# Patient Record
Sex: Female | Born: 1995 | Race: Black or African American | Hispanic: No | Marital: Married | Smoking: Never smoker
Health system: Southern US, Community
[De-identification: ages and names within clinical notes are randomized; demographics above are authoritative.]

## PROBLEM LIST (undated history)

## (undated) HISTORY — PX: BREAST REDUCTION SURGERY: SHX8

---

## 2019-06-05 ENCOUNTER — Emergency Department
Admission: EM | Admit: 2019-06-05 | Discharge: 2019-06-05 | Disposition: A | Discharge status: Home / Self Care | Payer: Self-pay | Attending: Emergency Medicine | Admitting: Emergency Medicine

## 2019-06-05 ENCOUNTER — Emergency Department: Payer: Self-pay

## 2019-06-05 ENCOUNTER — Other Ambulatory Visit: Payer: Self-pay

## 2019-06-05 DIAGNOSIS — O468X1 Other antepartum hemorrhage, first trimester: Secondary | ICD-10-CM | POA: Insufficient documentation

## 2019-06-05 DIAGNOSIS — O4690 Antepartum hemorrhage, unspecified, unspecified trimester: Secondary | ICD-10-CM

## 2019-06-05 DIAGNOSIS — O418X11 Other specified disorders of amniotic fluid and membranes, first trimester, fetus 1: Secondary | ICD-10-CM | POA: Insufficient documentation

## 2019-06-05 DIAGNOSIS — O418X10 Other specified disorders of amniotic fluid and membranes, first trimester, not applicable or unspecified: Secondary | ICD-10-CM

## 2019-06-05 DIAGNOSIS — Z3A10 10 weeks gestation of pregnancy: Secondary | ICD-10-CM | POA: Insufficient documentation

## 2019-06-05 LAB — BASIC METABOLIC PANEL
Anion gap: 8 (ref 5–15)
BUN: 9 mg/dL (ref 6–20)
CO2: 22 mmol/L (ref 22–32)
Calcium: 9.2 mg/dL (ref 8.9–10.3)
Chloride: 105 mmol/L (ref 98–111)
Creatinine, Ser: 0.58 mg/dL (ref 0.44–1.00)
GFR calc Af Amer: >60 mL/min (ref >60)
GFR calc non Af Amer: >60 mL/min (ref >60)
Glucose, Bld: 88 mg/dL (ref 70–99)
Potassium: 3.7 mmol/L (ref 3.5–5.1)
Sodium: 135 mmol/L (ref 135–145)

## 2019-06-05 LAB — CBC
HCT: 37 % (ref 36.0–46.0)
Hemoglobin: 12.3 g/dL (ref 12.0–15.0)
MCH: 30.1 pg (ref 26.0–34.0)
MCHC: 33.2 g/dL (ref 30.0–36.0)
MCV: 90.7 fL (ref 80.0–100.0)
Platelets: 258 10*3/uL (ref 150–400)
RBC: 4.08 MIL/uL (ref 3.87–5.11)
RDW: 11.9 % (ref 11.5–15.5)
WBC: 8 10*3/uL (ref 4.0–10.5)
nRBC: 0 % (ref 0.0–0.2)

## 2019-06-05 LAB — HCG, QUANTITATIVE, PREGNANCY: hCG, Beta Chain, Quant, S: 127137 m[IU]/mL — ABNORMAL HIGH (ref <5)

## 2019-06-05 LAB — ABO/RH: ABO/RH(D): O POS

## 2019-06-05 LAB — POCT PREGNANCY, URINE: Preg Test, Ur: POSITIVE — AB

## 2019-06-05 NOTE — ED Triage Notes (Signed)
Pt has been having light bleeding and pelvic pain that started 3 weeks ago.

## 2019-06-05 NOTE — ED Provider Notes (Signed)
[facility] Emergency Department Provider Note   ____________________________________________    I have reviewed the triage vital signs and the nursing notes.   HISTORY  Chief Complaint Vaginal Bleeding     HPI Caroline Ramirez is a 24 y.o. female reports that she is approximately [redacted] weeks pregnant who has not had any prenatal care to this point.  Recently moved to the area.  She reports vaginal spotting for approximately 3 weeks.  Occasional lower abdominal cramping.  Denies fevers or chills.  No dysuria.  No nausea or vomiting.  This is her first pregnancy.  No sick contacts.  Has not take anything for this  History reviewed. No pertinent past medical history.  There are no problems to display for this patient.   History reviewed. No pertinent surgical history.  Prior to Admission medications   Not on File     Allergies Patient has no allergy information on record.  History reviewed. No pertinent family history.  Social History Social History   Tobacco Use  . Smoking status: Never Smoker  . Smokeless tobacco: Never Used  Substance Use Topics  . Alcohol use: Not on file  . Drug use: Not on file    Review of Systems  Constitutional: No fever/chills Eyes: No visual changes.  ENT: No sore throat. Cardiovascular: No chest pain. Respiratory: Denies shortness of breath. Gastrointestinal: As above Genitourinary: As above Musculoskeletal: Negative for back pain. Skin: Negative for rash. Neurological: Negative for headaches or weakness   ____________________________________________   PHYSICAL EXAM:  VITAL SIGNS: ED Triage Vitals [06/05/19 0725]  Enc Vitals Group     BP (!) 132/94     Pulse Rate 82     Resp 16     Temp 98.2 F (36.8 C)     Temp Source Oral     SpO2 100 %     Weight 96.6 kg (213 lb)     Height 1.651 m (5\' 5" )     Head Circumference      Peak Flow      Pain Score 5     Pain Loc      Pain Edu?    Excl. in GC?     Constitutional: Alert and oriented.   Nose: No congestion/rhinnorhea. Mouth/Throat: Mucous membranes are moist.   Neck:  Painless ROM Cardiovascular: Normal rate, regular rhythm.  Good peripheral circulation. Respiratory: Normal respiratory effort.  No retractions. Gastrointestinal: Soft and nontender. No distention.  No CVA tenderness.  Musculoskeletal:  Warm and well perfused Neurologic:  Normal speech and language. No gross focal neurologic deficits are appreciated.  Skin:  Skin is warm, dry and intact. No rash noted. Psychiatric: Mood and affect are normal. Speech and behavior are normal.  ____________________________________________   LABS (all labs ordered are listed, but only abnormal results are displayed)  Labs Reviewed  HCG, QUANTITATIVE, PREGNANCY - Abnormal; Notable for the following components:      Result Value   hCG, Beta Chain, Quant, S 127,137 (*)    All other components within normal limits  POCT PREGNANCY, URINE - Abnormal; Notable for the following components:   Preg Test, Ur POSITIVE (*)    All other components within normal limits  CBC  BASIC METABOLIC PANEL  POC URINE PREG, ED  ABO/RH   ____________________________________________  EKG  None ____________________________________________  RADIOLOGY  Ultrasound demonstrates small subchorionic hemorrhage ____________________________________________   PROCEDURES  Procedure(s) performed: No  Procedures   Critical Care performed: No ____________________________________________  INITIAL IMPRESSION / ASSESSMENT AND PLAN / ED COURSE  Pertinent labs & imaging results that were available during my care of the patient were reviewed by me and considered in my medical decision making (see chart for details).  Patient presents approximately [redacted] weeks pregnant with vaginal spotting x3 weeks.  Differential includes subchorionic hemorrhage, miscarriage, less likely ectopic  pregnancy, possible cyst  Will obtain hCG, ABO Rh and obtain ultrasound.  Patient is Rh+, hCG 127,000.  Ultrasound performed IUP, live, 10 weeks 2 days, small subchorionic hemorrhage.  Discussed with patient, referred to health department for arrangement for OB follow-up.    ____________________________________________   FINAL CLINICAL IMPRESSION(S) / ED DIAGNOSES  Final diagnoses:  Vaginal bleeding in pregnancy  Subchorionic hematoma in first trimester, single or unspecified fetus        Note:  This document was prepared using Dragon voice recognition software and may include unintentional dictation errors.   Lavonia Drafts, MD 06/05/19 1255

## 2019-06-11 NOTE — Progress Notes (Signed)
Record abstracted per 06/11/19 phone interview Hal Hope, RN; Sharlette Dense, RN

## 2019-06-12 ENCOUNTER — Other Ambulatory Visit: Payer: Self-pay | Admitting: Physician Assistant

## 2019-06-12 ENCOUNTER — Encounter: Payer: Self-pay | Admitting: Physician Assistant

## 2019-06-12 ENCOUNTER — Ambulatory Visit: Payer: Medicaid Other | Admitting: Physician Assistant

## 2019-06-12 ENCOUNTER — Other Ambulatory Visit: Payer: Self-pay

## 2019-06-12 VITALS — BP 129/80 | HR 102 | Temp 98.4°F | Wt 217.0 lb

## 2019-06-12 DIAGNOSIS — O209 Hemorrhage in early pregnancy, unspecified: Secondary | ICD-10-CM

## 2019-06-12 DIAGNOSIS — Z3400 Encounter for supervision of normal first pregnancy, unspecified trimester: Secondary | ICD-10-CM

## 2019-06-12 DIAGNOSIS — O99210 Obesity complicating pregnancy, unspecified trimester: Secondary | ICD-10-CM

## 2019-06-12 DIAGNOSIS — E669 Obesity, unspecified: Secondary | ICD-10-CM | POA: Insufficient documentation

## 2019-06-12 DIAGNOSIS — Z23 Encounter for immunization: Secondary | ICD-10-CM

## 2019-06-12 DIAGNOSIS — Z1331 Encounter for screening for depression: Secondary | ICD-10-CM | POA: Insufficient documentation

## 2019-06-12 DIAGNOSIS — Z369 Encounter for antenatal screening, unspecified: Secondary | ICD-10-CM

## 2019-06-12 DIAGNOSIS — N926 Irregular menstruation, unspecified: Secondary | ICD-10-CM | POA: Insufficient documentation

## 2019-06-12 LAB — URINALYSIS
Bilirubin, UA: NEGATIVE
Ketones, UA: NEGATIVE
Leukocytes,UA: NEGATIVE
Nitrite, UA: NEGATIVE
Protein,UA: NEGATIVE
RBC, UA: NEGATIVE
Specific Gravity, UA: 1.03 (ref 1.005–1.030)
Urobilinogen, Ur: 0.2 mg/dL (ref 0.2–1.0)
pH, UA: 6 (ref 5.0–7.5)

## 2019-06-12 LAB — OB RESULTS CONSOLE HIV ANTIBODY (ROUTINE TESTING): HIV: NONREACTIVE

## 2019-06-12 MED ORDER — ASPIRIN EC 81 MG PO TBEC: 81.0000 mg | DELAYED_RELEASE_TABLET | Freq: Every day | ORAL | Status: AC

## 2019-06-12 NOTE — Progress Notes (Signed)
[facility] HEALTH DEPT [facility] [address] [place] [address] [phone]  INITIAL PRENATAL VISIT NOTE  Subjective:  Caroline Ramirez is a 24 y.o. G1P0 at [redacted]w[redacted]d being seen today to start prenatal care at the [facility] Department.  She is currently monitored for the following issues for this high-risk pregnancy and has Supervision of normal first pregnancy, antepartum; Obesity affecting pregnancy, antepartum; Vaginal bleeding in pregnancy, first trimester; Class 2 obesity without serious comorbidity with body mass index (BMI) of 36.0 to 36.9 in adult; Positive depression screening; and Irregular menses on their problem list.  Patient reports bilat breast tenderness.  Contractions: Not present. Vag. Bleeding: None.  Movement: Absent. Denies leaking of fluid. Very happy about pregnancy, desired for 1 year with partner, h/o irreg menses. Moved from [place] 1 wk ago with partner, looking for work. Mom lives in [place] and may move to [facility] [facility] for local support.  Indications for ASA therapy (per uptodate) One of the following: Previous pregnancy with preeclampsia, especially early onset and with an adverse outcome No Multifetal gestation No Chronic hypertension No Type 1 or 2 diabetes mellitus No Chronic kidney disease No Autoimmune disease (antiphospholipid syndrome, systemic lupus erythematosus) No  Two or more of the following: Nulliparity Yes Obesity (body mass index >30 kg/m2) Yes Family history of preeclampsia in mother or sister No Age ?35 years No Sociodemographic characteristics (African American race, low socioeconomic level) Yes Personal risk factors (eg, previous pregnancy with low birth weight or small for gestational age infant, previous adverse pregnancy outcome [eg, stillbirth], interval >10 years between pregnancies) No   The following portions of the patient's history were reviewed and updated as appropriate: allergies,  current medications, past family history, past medical history, past social history, past surgical history and problem list. Problem list updated.  Objective:   Vitals:   06/12/19 0859  BP: 129/80  Pulse: (!) 102  Temp: 98.4 F (36.9 C)  Weight: 217 lb (98.4 kg)    Fetal Status: Fetal Heart Rate (bpm): not heard Fundal Height: 12 cm Movement: Absent  Presentation: Undeterminable   Physical Exam Constitutional:      General: She is not in acute distress.    Appearance: She is obese. She is not toxic-appearing.  HENT:     Head: Normocephalic and atraumatic.     Right Ear: External ear normal.     Left Ear: External ear normal.     Nose: No congestion or rhinorrhea.     Mouth/Throat:     Mouth: Mucous membranes are moist.     Pharynx: Oropharynx is clear. No oropharyngeal exudate or posterior oropharyngeal erythema.  Eyes:     General: No scleral icterus.    Extraocular Movements: Extraocular movements intact.     Conjunctiva/sclera: Conjunctivae normal.  Cardiovascular:     Rate and Rhythm: Normal rate and regular rhythm.     Heart sounds: Normal heart sounds. No murmur.  Pulmonary:     Effort: Pulmonary effort is normal. No respiratory distress.     Breath sounds: Normal breath sounds. No wheezing.  Chest:     Breasts:        Right: Skin change and tenderness present. No inverted nipple or nipple discharge.        Left: Inverted nipple and tenderness present. No nipple discharge.     Comments: Surgical scars under breasts. Hypopigmentation R nipple - pt states present since breast reduction surgery 1 year ago Abdominal:  General: Bowel sounds are normal.     Palpations: Abdomen is soft.     Tenderness: There is no abdominal tenderness. There is no guarding.  Genitourinary:    General: Normal vulva.     Exam position: Lithotomy position.     Pubic Area: No rash.      Labia:        Right: No rash, tenderness or lesion.        Left: No rash, tenderness or lesion.       Urethra: No urethral lesion.     Vagina: No vaginal discharge, erythema, tenderness, bleeding or lesions.     Cervix: No discharge, friability, lesion or cervical bleeding.     Uterus: Enlarged. Not tender.      Adnexa:        Right: No tenderness.         Left: No tenderness.       Rectum: No external hemorrhoid.     Comments: 12 week size uterus Musculoskeletal:        General: No swelling or deformity.     Cervical back: Normal range of motion and neck supple. No tenderness.     Right lower leg: No edema.     Left lower leg: No edema.  Lymphadenopathy:     Cervical: No cervical adenopathy.     Lower Body: No right inguinal adenopathy. No left inguinal adenopathy.  Skin:    General: Skin is warm and dry.     Findings: No lesion or rash.     Comments: Tattoo L upper chest  Neurological:     Mental Status: She is alert and oriented to person, place, and time.  Psychiatric:        Behavior: Behavior normal.        Thought Content: Thought content normal.        Judgment: Judgment normal.     Assessment and Plan:  Pregnancy: G1P0 at [redacted]w[redacted]d  1. Supervision of normal first pregnancy, antepartum Pt desires genetic counseling/possible early prenatal diagnosis - referral done. - HIV [place] [facility] - Chlamydia/GC NAA, Confirmation - Urine Culture - TSH - QuantiFERON-TB Gold Plus - Prenatal Profile with Varicella(282020) - HCV Ab w Reflex to Quant PCR - Urinalysis (Urine Dip)  2. Obesity affecting pregnancy, antepartum Start daily low-dose aspirin at 12 weeks. MNT/Nutrition referral. - Protein / creatinine ratio, urine  (Spot) - Comprehensive metabolic panel - TSH - Hgb A1c w/o eAG - Glucose, 1 hour gestational  3. Vaginal bleeding in pregnancy, first trimester Resolved for now, pt to alert [place] if resumes.  4. Positive depression screening Monitor mood. Pt denies SI/HI, states happy about pregnancy.    Discussed overview of care and coordination with inpatient  delivery practices including [facility], [facility], [facility] and [facility].   Preterm labor symptoms and general obstetric precautions including but not limited to vaginal bleeding, contractions, leaking of fluid and fetal movement were reviewed in detail with the patient.  Please refer to After Visit Summary for other counseling recommendations.   Return in about 4 weeks (around 07/10/2019) for Routine prenatal care.  No future appointments.  Lora Havens, PA-C

## 2019-06-12 NOTE — Progress Notes (Signed)
In for new ob; has PNV but causing nausea; seen @ ED 06/05/19 due to spotting-has since resolved; Peggyann Juba & >BMI labs today; desires flu vaccine today; ROI signed for 2020 pap smear in [place] Sharlette Dense, RN Informed will be notified of 1st screening appt Sharlette Dense, RN

## 2019-06-13 LAB — PROTEIN / CREATININE RATIO, URINE
Creatinine, Urine: 293.2 mg/dL
Protein, Ur: 17.8 mg/dL
Protein/Creat Ratio: 61 mg/g creat (ref 0–200)

## 2019-06-14 LAB — CHLAMYDIA/GC NAA, CONFIRMATION
Chlamydia trachomatis, NAA: NEGATIVE
Neisseria gonorrhoeae, NAA: NEGATIVE

## 2019-06-14 LAB — URINE CULTURE

## 2019-06-15 LAB — TSH: TSH: 1.58 u[IU]/mL (ref 0.450–4.500)

## 2019-06-15 LAB — CBC/D/PLT+RPR+RH+ABO+RUB AB...
Antibody Screen: NEGATIVE
Basophils Absolute: 0 10*3/uL (ref 0.0–0.2)
Basos: 1 %
EOS (ABSOLUTE): 0.1 10*3/uL (ref 0.0–0.4)
Eos: 2 %
Hematocrit: 36.6 % (ref 34.0–46.6)
Hemoglobin: 12.3 g/dL (ref 11.1–15.9)
Hepatitis B Surface Ag: NEGATIVE
Immature Grans (Abs): 0 10*3/uL (ref 0.0–0.1)
Immature Granulocytes: 0 %
Lymphocytes Absolute: 2.1 10*3/uL (ref 0.7–3.1)
Lymphs: 40 %
MCH: 31.1 pg (ref 26.6–33.0)
MCHC: 33.6 g/dL (ref 31.5–35.7)
MCV: 93 fL (ref 79–97)
Monocytes Absolute: 0.5 10*3/uL (ref 0.1–0.9)
Monocytes: 9 %
Neutrophils Absolute: 2.6 10*3/uL (ref 1.4–7.0)
Neutrophils: 48 %
Platelets: 257 10*3/uL (ref 150–450)
RBC: 3.95 x10E6/uL (ref 3.77–5.28)
RDW: 11.8 % (ref 11.7–15.4)
RPR Ser Ql: NONREACTIVE
Rh Factor: POSITIVE
Rubella Antibodies, IGG: 10.2 index (ref >0.99)
Varicella zoster IgG: 483 index (ref >165)
WBC: 5.3 10*3/uL (ref 3.4–10.8)

## 2019-06-15 LAB — QUANTIFERON-TB GOLD PLUS
QuantiFERON Mitogen Value: >10 IU/mL
QuantiFERON Nil Value: 0.79 IU/mL
QuantiFERON TB1 Ag Value: 0.89 IU/mL
QuantiFERON TB2 Ag Value: 0.52 IU/mL
QuantiFERON-TB Gold Plus: NEGATIVE

## 2019-06-15 LAB — COMPREHENSIVE METABOLIC PANEL
ALT: 20 IU/L (ref 0–32)
AST: 13 IU/L (ref 0–40)
Albumin/Globulin Ratio: 1.7 (ref 1.2–2.2)
Albumin: 4.2 g/dL (ref 3.9–5.0)
Alkaline Phosphatase: 50 IU/L (ref 39–117)
BUN/Creatinine Ratio: 10 (ref 9–23)
BUN: 6 mg/dL (ref 6–20)
Bilirubin Total: 0.5 mg/dL (ref 0.0–1.2)
CO2: 20 mmol/L (ref 20–29)
Calcium: 9.6 mg/dL (ref 8.7–10.2)
Chloride: 103 mmol/L (ref 96–106)
Creatinine, Ser: 0.6 mg/dL (ref 0.57–1.00)
GFR calc Af Amer: 149 mL/min/{1.73_m2} (ref >59)
GFR calc non Af Amer: 129 mL/min/{1.73_m2} (ref >59)
Globulin, Total: 2.5 g/dL (ref 1.5–4.5)
Glucose: 120 mg/dL — ABNORMAL HIGH (ref 65–99)
Potassium: 3.6 mmol/L (ref 3.5–5.2)
Sodium: 135 mmol/L (ref 134–144)
Total Protein: 6.7 g/dL (ref 6.0–8.5)

## 2019-06-15 LAB — HCV INTERPRETATION

## 2019-06-15 LAB — HGB A1C W/O EAG: Hgb A1c MFr Bld: 4.4 % — ABNORMAL LOW (ref 4.8–5.6)

## 2019-06-15 LAB — HCV AB W REFLEX TO QUANT PCR: HCV Ab: <0.1 s/co ratio (ref 0.0–0.9)

## 2019-06-15 LAB — GLUCOSE, 1 HOUR GESTATIONAL: Gestational Diabetes Screen: 119 mg/dL (ref 65–139)

## 2019-06-16 ENCOUNTER — Telehealth: Payer: Self-pay

## 2019-06-16 NOTE — Telephone Encounter (Signed)
Phone call to pt. Pt informed of [facility] appt on 06/23/2019 for genetic counseling and U/S at [facility] (enter through [facility]), arrive by 11:45.

## 2019-06-23 ENCOUNTER — Ambulatory Visit
Admission: RE | Admit: 2019-06-23 | Discharge: 2019-06-23 | Disposition: A | Discharge status: Home / Self Care | Payer: Medicaid Other | Source: Ambulatory Visit | Attending: Maternal & Fetal Medicine | Admitting: Maternal & Fetal Medicine

## 2019-06-23 ENCOUNTER — Ambulatory Visit (HOSPITAL_BASED_OUTPATIENT_CLINIC_OR_DEPARTMENT_OTHER)
Admission: RE | Admit: 2019-06-23 | Discharge: 2019-06-23 | Disposition: A | Discharge status: Home / Self Care | Payer: Medicaid Other | Source: Ambulatory Visit | Attending: Maternal & Fetal Medicine | Admitting: Maternal & Fetal Medicine

## 2019-06-23 ENCOUNTER — Other Ambulatory Visit: Payer: Self-pay

## 2019-06-23 DIAGNOSIS — Z36 Encounter for antenatal screening for chromosomal anomalies: Secondary | ICD-10-CM

## 2019-06-23 DIAGNOSIS — Z369 Encounter for antenatal screening, unspecified: Secondary | ICD-10-CM | POA: Diagnosis not present

## 2019-06-23 NOTE — Progress Notes (Signed)
Referring physician: [facility] Department Length of consultation:  25 minutes  Caroline Ramirez was referred to [facility] for genetic counseling to review prenatal screening and testing options.  This note summarizes the information we discussed.    We offered the following routine screening tests for this pregnancy:  The most accurate screening option for chromosome conditions is cell free fetal DNA testing.  Though this is typically reserved for pregnancies at increased risk for aneuploidy, it is currently being made available and many insurance companies are adding coverage for this testing in low risk patients during COVID.  This test utilizes a maternal blood sample and DNA sequencing technology to isolate circulating cell free fetal DNA from maternal plasma.  The fetal DNA can then be analyzed for DNA sequences that are derived from the three most common chromosomes involved in aneuploidy, chromosomes 13, 18, and 21.  If the overall amount of DNA is greater than the expected level for any of these chromosomes, aneuploidy is suspected.  The detection rates are >99% for Down syndrome, >98% for trisomy 18 and >91% for Trisomy 13.  While we do not consider it a replacement for invasive testing and karyotype analysis, a negative result from this testing would be reassuring, though not a guarantee of a normal chromosome complement for the baby.  An abnormal result may be suggestive of an abnormal chromosome complement, though we would still recommend CVS or amniocentesis to confirm any findings from this testing.  First trimester screening, which includes nuchal translucency ultrasound screen and first trimester maternal serum marker screening, is the test that has most recently been available for low risk patients.  The nuchal translucency has approximately an 80% detection rate for Down syndrome and can be positive for other chromosome abnormalities as well as  congenital heart defects.  When combined with a maternal serum marker screening, the detection rate is up to 90% for Down syndrome and up to 97% for trisomy 18.   Given current recommendations during COVID, we are offering only the biochemical testing portion of this testing (without the ultrasound and NT portion), which has a much lower detection rate.  Maternal serum marker screening, or "quad" screen, is a blood test that measures pregnancy proteins, can provide risk assessments for Down syndrome, trisomy 18, and open neural tube defects (spina bifida, anencephaly). Because it does not directly examine the fetus, it cannot positively diagnose or rule out these problems. This is a second trimester option which could be offered along with the anatomy ultrasound. It can detect approximately 75% of babies with Down syndrome, 80% of babies with open spina bifida and 70% of babies with trisomy 35.  Targeted ultrasound uses high frequency sound waves to create an image of the developing fetus.  An ultrasound is often recommended as a routine means of evaluating the pregnancy.  It is also used to screen for fetal anatomy problems (for example, a heart defect) that might be suggestive of a chromosomal or other abnormality. We are currently not recommending a first trimester ultrasound other than that which would be ordered for dating and viability.  Should these screening tests indicate an increased concern, then the following additional testing options would be offered:  The chorionic villus sampling procedure is available for first trimester chromosome analysis.  This involves the withdrawal of a small amount of chorionic villi (tissue from the developing placenta).  Risk of pregnancy loss is estimated to be approximately 1 in 200 to 1 in  100 (0.5 to 1%).  There is approximately a 1% (1 in 100) chance that the CVS chromosome results will be unclear.  Chorionic villi cannot be tested for neural tube defects.      Amniocentesis involves the removal of a small amount of amniotic fluid from the sac surrounding the fetus with the use of a thin needle inserted through the maternal abdomen and uterus.  Ultrasound guidance is used throughout the procedure.  Fetal cells from amniotic fluid are directly evaluated and > 99.5% of chromosome problems and > 98% of open neural tube defects can be detected. This procedure is generally performed after the 15th week of pregnancy.  The main risks to this procedure include complications leading to miscarriage in less than 1 in 200 cases (0.5%).  Cystic Fibrosis and Spinal Muscular Atrophy (SMA) screening were also discussed with the patient. Both conditions are recessive, which means that both parents must be carriers in order to have a child with the disease.  Cystic fibrosis (CF) is one of the most common genetic conditions in persons of Caucasian ancestry.  This condition occurs in approximately 1 in 2,500 Caucasian persons and results in thickened secretions in the lungs, digestive, and reproductive systems.  For a baby to be at risk for having CF, both of the parents must be carriers for this condition.  Approximately 1 in 6 Caucasian persons is a carrier for CF.  Current carrier testing looks for the most common mutations in the gene for CF and can detect approximately 90% of carriers in the Caucasian population.  This means that the carrier screening can greatly reduce, but cannot eliminate, the chance for an individual to have a child with CF.  If an individual is found to be a carrier for CF, then carrier testing would be available for the partner. As part of [place] [employer]'s newborn screening profile, all babies born in the state of [place] will have a two-tier screening process.  Specimens are first tested to determine the concentration of immunoreactive trypsinogen (IRT).  The top 5% of specimens with the highest IRT values then undergo DNA testing using a panel of  over 40 common CF mutations. SMA is a neurodegenerative disorder that leads to atrophy of skeletal muscle and overall weakness.  This condition is also more prevalent in the Caucasian population, with 1 in 40-1 in 60 persons being a carrier and 1 in 6,000-1 in 10,000 children being affected.  There are multiple forms of the disease, with some causing death in infancy to other forms with survival into adulthood.  The genetics of SMA is complex, but carrier screening can detect up to 95% of carriers in the Caucasian population.  Similar to CF, a negative result can greatly reduce, but cannot eliminate, the chance to have a child with SMA. The patient declined carrier screening for CF and SMA.  We talked about the option of signing up for Early Check to have the baby tested for SMA after delivery as part of a new study in [place].  This registration can be done online prior to delivery if desired. Screening for hemoglobinopathies was also offered.    We obtained a detailed family history and pregnancy history.  This is the first pregnancy for this couple.  The patient reported light spotting which resolved about 2 weeks ago.  She has had no other complications or exposures to medications, alcohol, tobacco or recreational drugs. The patient and her partner are both of [place] and are not consanguinous.  The family history was reported to be unremarkable for birth defects, intellectual delays, recurrent pregnancy loss or known chromosome abnormalities.  After consideration of the options, Ms. Voiles declined all screening and testing options as well as carrier screening and desires ultrasound only.    The patient was encouraged to call with questions or concerns.  We can be contacted at [phone].  Labs ordered: none - declined    Wilburt Finlay, MS, CGC

## 2019-06-30 ENCOUNTER — Encounter: Payer: Self-pay | Admitting: Emergency Medicine

## 2019-06-30 ENCOUNTER — Emergency Department
Admission: EM | Admit: 2019-06-30 | Discharge: 2019-06-30 | Disposition: A | Discharge status: Home / Self Care | Payer: Medicaid Other | Attending: Emergency Medicine | Admitting: Emergency Medicine

## 2019-06-30 ENCOUNTER — Emergency Department: Payer: Medicaid Other

## 2019-06-30 ENCOUNTER — Other Ambulatory Visit: Payer: Self-pay

## 2019-06-30 DIAGNOSIS — O4692 Antepartum hemorrhage, unspecified, second trimester: Secondary | ICD-10-CM | POA: Diagnosis present

## 2019-06-30 DIAGNOSIS — Z79899 Other long term (current) drug therapy: Secondary | ICD-10-CM | POA: Diagnosis not present

## 2019-06-30 DIAGNOSIS — O4690 Antepartum hemorrhage, unspecified, unspecified trimester: Secondary | ICD-10-CM

## 2019-06-30 DIAGNOSIS — Z7982 Long term (current) use of aspirin: Secondary | ICD-10-CM | POA: Insufficient documentation

## 2019-06-30 LAB — CBC WITH DIFFERENTIAL/PLATELET
Abs Immature Granulocytes: 0.01 10*3/uL (ref 0.00–0.07)
Basophils Absolute: 0 10*3/uL (ref 0.0–0.1)
Basophils Relative: 0 %
Eosinophils Absolute: 0.1 10*3/uL (ref 0.0–0.5)
Eosinophils Relative: 2 %
HCT: 36.4 % (ref 36.0–46.0)
Hemoglobin: 12.5 g/dL (ref 12.0–15.0)
Immature Granulocytes: 0 %
Lymphocytes Relative: 55 %
Lymphs Abs: 2.9 10*3/uL (ref 0.7–4.0)
MCH: 30.9 pg (ref 26.0–34.0)
MCHC: 34.3 g/dL (ref 30.0–36.0)
MCV: 89.9 fL (ref 80.0–100.0)
Monocytes Absolute: 0.4 10*3/uL (ref 0.1–1.0)
Monocytes Relative: 7 %
Neutro Abs: 2 10*3/uL (ref 1.7–7.7)
Neutrophils Relative %: 36 %
Platelets: 247 10*3/uL (ref 150–400)
RBC: 4.05 MIL/uL (ref 3.87–5.11)
RDW: 11.9 % (ref 11.5–15.5)
WBC: 5.4 10*3/uL (ref 4.0–10.5)
nRBC: 0 % (ref 0.0–0.2)

## 2019-06-30 LAB — HCG, QUANTITATIVE, PREGNANCY: hCG, Beta Chain, Quant, S: 87988 m[IU]/mL — ABNORMAL HIGH (ref <5)

## 2019-06-30 NOTE — ED Triage Notes (Signed)
Pt c/o heavy vaginal bleeding with clots, states she is [redacted] weeks pregnant.

## 2019-06-30 NOTE — ED Provider Notes (Signed)
[facility] Emergency Department Provider Note   ____________________________________________   First MD Initiated Contact with Patient 06/30/19 [phone]     (approximate)  I have reviewed the triage vital signs and the nursing notes.   HISTORY  Chief Complaint Vaginal Bleeding    HPI Caroline Ramirez is a 24 y.o. female here for evaluation of passing vaginal clots while pregnant  Patient reports that she had some scant amount of bleeding for the last several weeks of pregnancy, diagnosed as a subchorionic hemorrhage here at the ER a few weeks ago.  She seen the health department since.  They continue to follow her and this morning when she got up she noticed that she has having what appear to be somewhat large stringy clot that came from the vagina.  She is concerned about possibly miscarrying  No associated abdominal pain.  No cramping or discomfort.  No pain or burning with urination.  No recent illness, no fevers chills or cough.  She describes one episode of seeing clots coming from the vagina, noted this morning.   No past medical history on file.  Patient Active Problem List   Diagnosis Date Noted  . Encounter for antenatal screening for chromosomal anomalies   . Supervision of normal first pregnancy, antepartum 06/12/2019  . Obesity affecting pregnancy, antepartum 06/12/2019  . Vaginal bleeding in pregnancy, first trimester 06/12/2019  . Class 2 obesity without serious comorbidity with body mass index (BMI) of 36.0 to 36.9 in adult 06/12/2019  . Positive depression screening 06/12/2019  . Irregular menses 06/12/2019    Past Surgical History:  Procedure Laterality Date  . BREAST REDUCTION SURGERY     2019    Prior to Admission medications   Medication Sig Start Date End Date Taking? Authorizing Provider  aspirin EC 81 MG tablet Take 1 tablet (81 mg total) by mouth daily. 06/17/19 11/30/19  Landry Dyke, PA-C  Prenatal Vit-Fe  Fumarate-FA (PRENATAL MULTIVITAMIN) TABS tablet Take 1 tablet by mouth daily at 12 noon.    [provider]    Allergies Patient has no known allergies.  Family History  Problem Relation Age of Onset  . Hypertension Mother     Social History Social History   Tobacco Use  . Smoking status: Never Smoker  . Smokeless tobacco: Never Used  Substance Use Topics  . Alcohol use: Not Currently  . Drug use: Never    Review of Systems Constitutional: No fever/chills Cardiovascular: Denies chest pain. Respiratory: Denies shortness of breath. Gastrointestinal: No abdominal pain.   Genitourinary: Negative for dysuria.  See HPI.  Reports she is 13 weeks 6 days pregnant Musculoskeletal: Negative for back pain. Skin: Negative for rash. Neurological: Negative for headaches, areas of focal weakness or numbness.    ____________________________________________   PHYSICAL EXAM:  VITAL SIGNS: ED Triage Vitals [06/30/19 0819]  Enc Vitals Group     BP (!) 143/86     Pulse      Resp 16     Temp 98.4 F (36.9 C)     Temp Source Oral     SpO2 100 %     Weight 214 lb 8.1 oz (97.3 kg)     Height 5\' 5"  (1.651 m)     Head Circumference      Peak Flow      Pain Score 0     Pain Loc      Pain Edu?      Excl. in GC?  Constitutional: Alert and oriented. Well appearing and in no acute distress. Eyes: Conjunctivae are normal. Head: Atraumatic. Nose: No congestion/rhinnorhea. Mouth/Throat: Mucous membranes are moist. Neck: No stridor.  Cardiovascular: Normal rate, regular rhythm. Grossly normal heart sounds.  Good peripheral circulation. Respiratory: Normal respiratory effort.  No retractions. Lungs CTAB. Gastrointestinal: Soft and nontender. No distention.  Not obviously gravid.  No suprapubic abdominal pain or discomfort. Musculoskeletal: No lower extremity tenderness nor edema. Neurologic:  Normal speech and language. No gross focal neurologic deficits are appreciated.    Skin:  Skin is warm, dry and intact. No rash noted. Psychiatric: Mood and affect are normal. Speech and behavior are normal.  ____________________________________________   LABS (all labs ordered are listed, but only abnormal results are displayed)  Labs Reviewed  HCG, QUANTITATIVE, PREGNANCY - Abnormal; Notable for the following components:      Result Value   hCG, Beta Chain, Quant, [place] 87,988 (*)    All other components within normal limits  CBC WITH DIFFERENTIAL/PLATELET   ____________________________________________  EKG   ____________________________________________  RADIOLOGY  IMPRESSION:  Single live intrauterine gestation at 13 weeks 6 days EGA.   No acute abnormalities.    Personally reviewed ultrasound results, interpretation reviewed as 13 weeks 6 days, no acute abnormalities ____________________________________________   PROCEDURES  Procedure(s) performed: None  Procedures  Critical Care performed: No  ____________________________________________   INITIAL IMPRESSION / ASSESSMENT AND PLAN / ED COURSE  Pertinent labs & imaging results that were available during my care of the patient were reviewed by me and considered in my medical decision making (see chart for details).   Patient reports episode of clotted blood in the vagina this morning.  History of subchorionic hemorrhage.  Currently very reassuring exam with no evidence of being toxic.  CBC normal.  No anemia.  Rh+  Previously determined to be Rh+.  Following with the health department had recent urine testing and STD screening are reassuring.  No signs or symptoms of infection.  We will proceed with ultrasound to evaluate for fetal wellbeing and etiology for bleeding    ----------------------------------------- 10:33 AM on 06/30/2019 -----------------------------------------  Reassuring ultrasound results.  Patient reports no ongoing symptoms no further bleeding.  Resting comfortably.   Discussed with her careful return precautions, recommended follow-up with her OB/GYN clinic who is now the [facility] perinatology group per her referral there by the health department  Return precautions and treatment recommendations and follow-up discussed with the patient who is agreeable with the plan.   ____________________________________________   FINAL CLINICAL IMPRESSION(S) / ED DIAGNOSES  Final diagnoses:  Vaginal bleeding in pregnancy, second trimester        Note:  This document was prepared using Dragon voice recognition software and may include unintentional dictation errors       Delman Kitten, MD 06/30/19 [phone]

## 2019-06-30 NOTE — Discharge Instructions (Signed)
Please follow up closely with obstetrics and gynecology or your primary doctor.  Return to the emergency room if your bleeding worsens, you become weak and dizzy or lightheaded, you have an episode of passing out, develop severe bleeding such as more than 1 soaked pad per hour for more than 3 straight hours, develop abdominal or pelvic pain, fevers chills or other new concerns arise.   

## 2019-06-30 NOTE — ED Notes (Signed)
See triage note  Presents with some vaginal bleeding  States she is approx [redacted] weeks pregnant  Developed some bleeding and she noticed some blood clots

## 2019-06-30 NOTE — ED Notes (Signed)
US at bedside

## 2019-07-10 ENCOUNTER — Ambulatory Visit: Payer: Self-pay

## 2019-07-21 ENCOUNTER — Other Ambulatory Visit: Payer: Self-pay | Admitting: Maternal & Fetal Medicine

## 2019-07-21 ENCOUNTER — Other Ambulatory Visit: Payer: Self-pay | Admitting: Family Medicine

## 2019-07-21 DIAGNOSIS — O99212 Obesity complicating pregnancy, second trimester: Secondary | ICD-10-CM

## 2019-07-21 DIAGNOSIS — O24113 Pre-existing diabetes mellitus, type 2, in pregnancy, third trimester: Secondary | ICD-10-CM

## 2019-07-24 ENCOUNTER — Telehealth: Payer: Self-pay

## 2019-07-24 NOTE — Telephone Encounter (Signed)
[facility] 07/10/19-attempted to reschedule appt.-no answer, left voicemail message Sharlette Dense, RN

## 2019-07-24 NOTE — Telephone Encounter (Signed)
07/24/2019 TC to patient to RS Missed appointment.

## 2019-07-28 ENCOUNTER — Inpatient Hospital Stay: Admission: RE | Admit: 2019-07-28 | Payer: Medicaid Other | Source: Ambulatory Visit

## 2019-07-28 NOTE — Telephone Encounter (Signed)
Call to client to reschedule missed [facility] appt. Left message to call with number to call provided. Bharat Antillon, RN  

## 2019-08-01 ENCOUNTER — Telehealth: Payer: Self-pay

## 2019-08-01 NOTE — Telephone Encounter (Signed)
Attempted to call to reschedule appt-left voicemail message Sharlette Dense, RN

## 2019-08-04 NOTE — Telephone Encounter (Signed)
Call to client to reschedule missed [facility] appt. Left message to call with number to call provided. As message previously left for client with no response, call made to emergency contact Enid Cutter, spouse). When he answered, RN asked to speak to [place] and he stated "sorry, I will have her call  You." Jossie Ng, RN

## 2019-08-04 NOTE — Telephone Encounter (Signed)
Please refer to other open phone note on client regarding rescheduling of missed appt.Jossie Ng, RN

## 2019-08-05 DIAGNOSIS — Z91199 Patient's noncompliance with other medical treatment and regimen due to unspecified reason: Secondary | ICD-10-CM | POA: Insufficient documentation

## 2019-08-05 DIAGNOSIS — O09892 Supervision of other high risk pregnancies, second trimester: Secondary | ICD-10-CM | POA: Insufficient documentation

## 2020-05-10 IMAGING — US US OB COMP LESS 14 WK
1 series · 14 of 28 positions shown · non-contrast
Comparison: None

CLINICAL DATA: Vaginal bleeding for 3 weeks, LMP = 03/25/2019,
quantitative beta HCG = 127,137

EXAM:
OBSTETRIC <14 WK ULTRASOUND
TECHNIQUE: Transabdominal ultrasound was performed for evaluation of the
gestation as well as the maternal uterus and adnexal regions.

[Series 1: us ob comp less 14 wks · 14 of 55 slices shown]
[im 3/55]
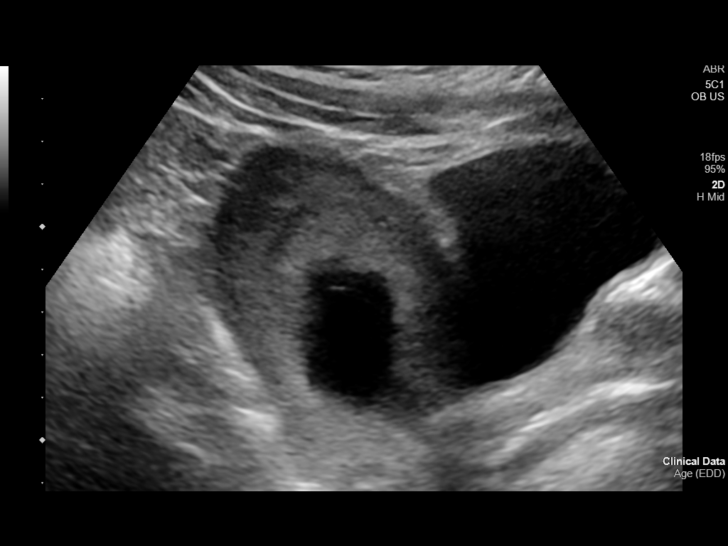
[im 7/55]
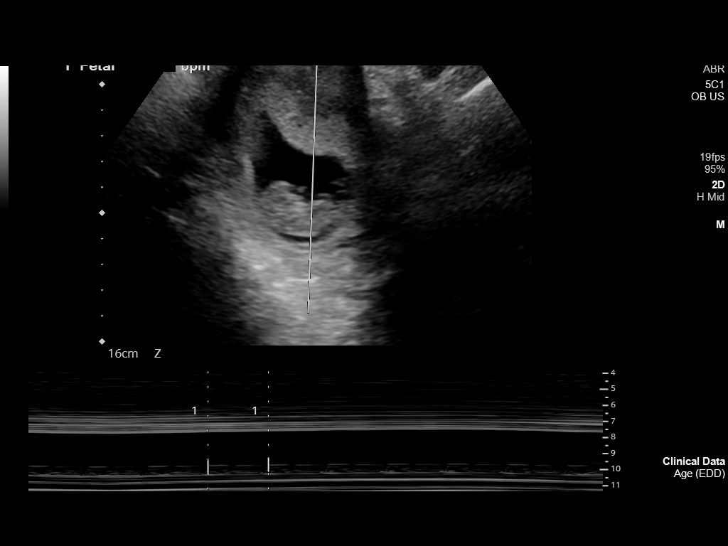
[im 11/55]
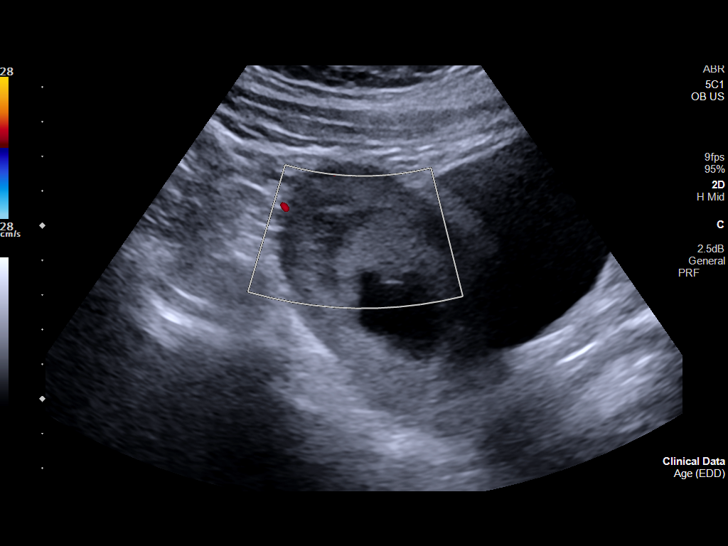
[im 15/55]
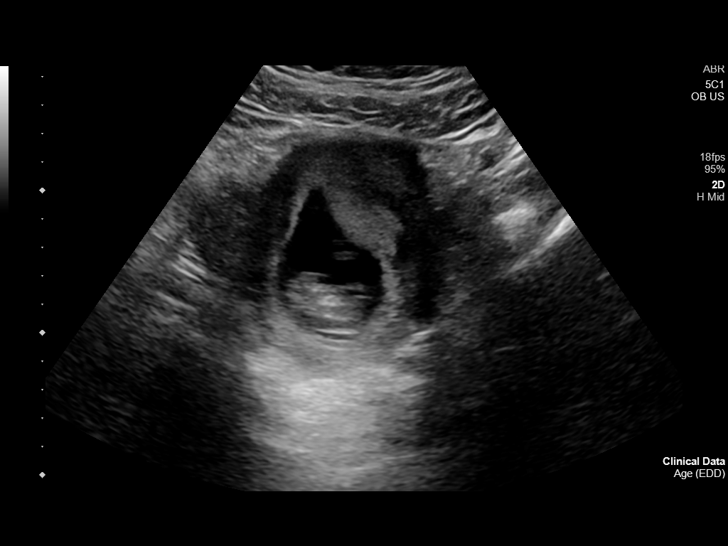
[im 19/55]
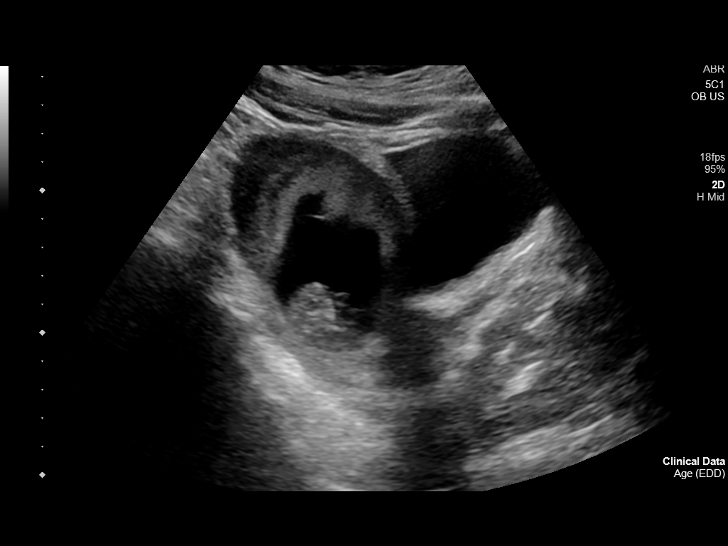
[im 23/55]
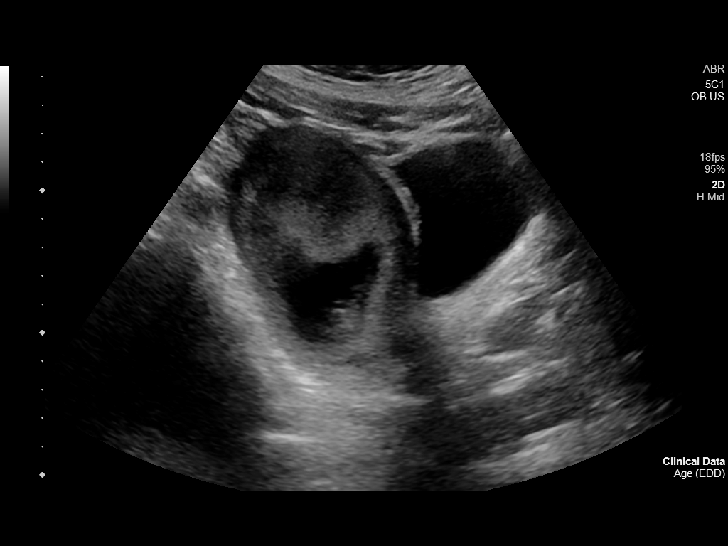
[im 27/55]
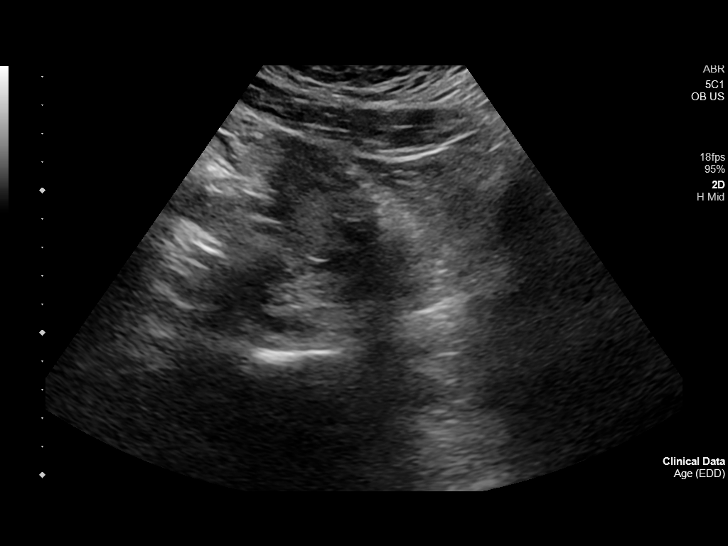
[im 31/55]
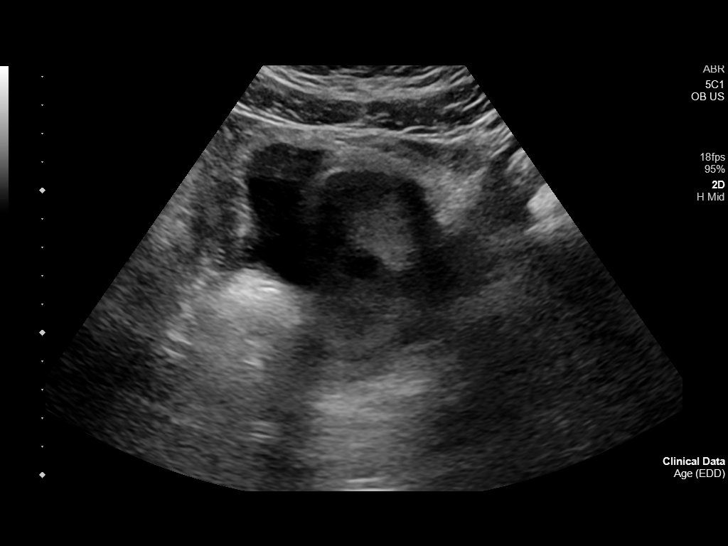
[im 35/55]
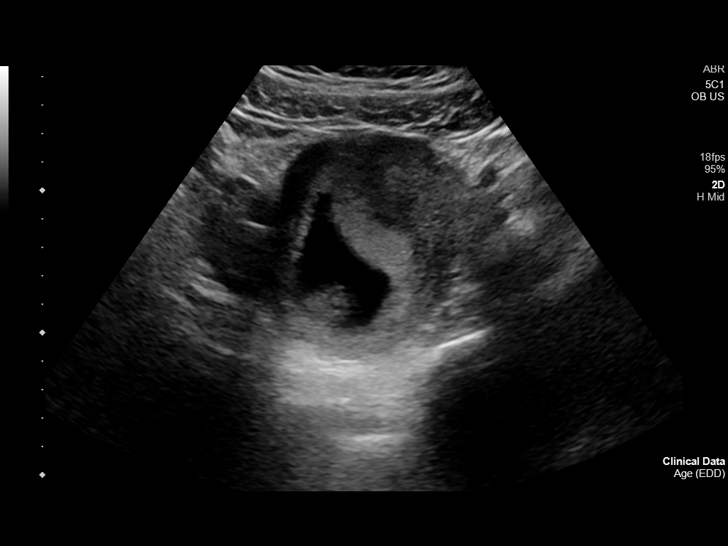
[im 39/55]
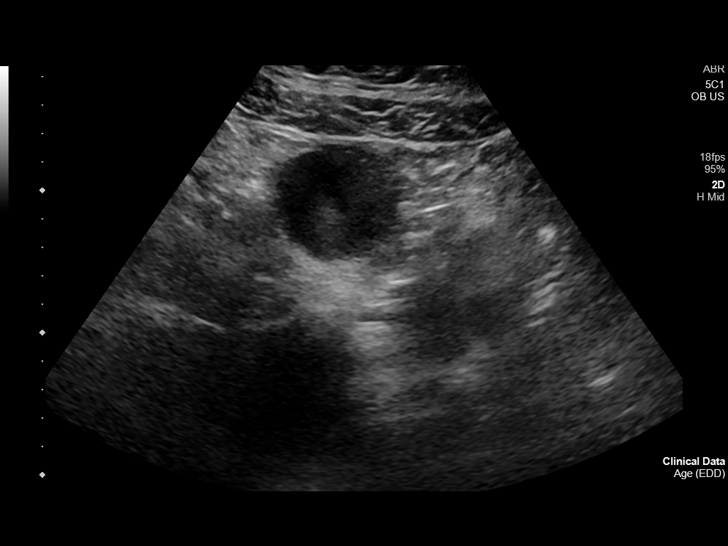
[im 43/55]
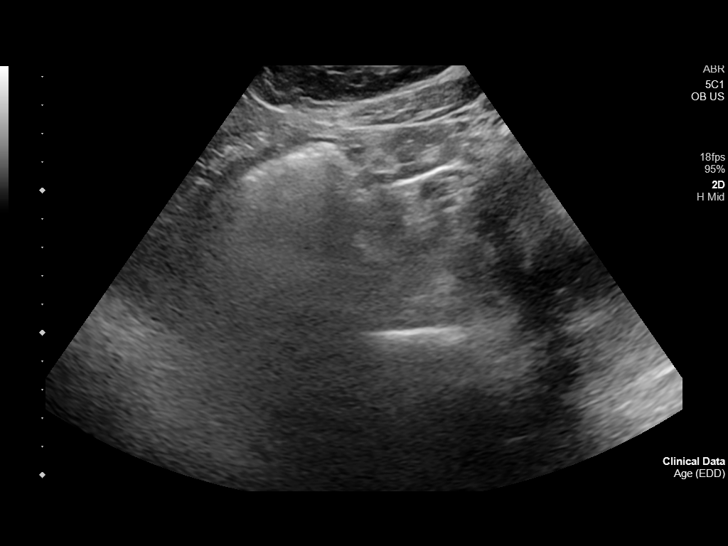
[im 47/55]
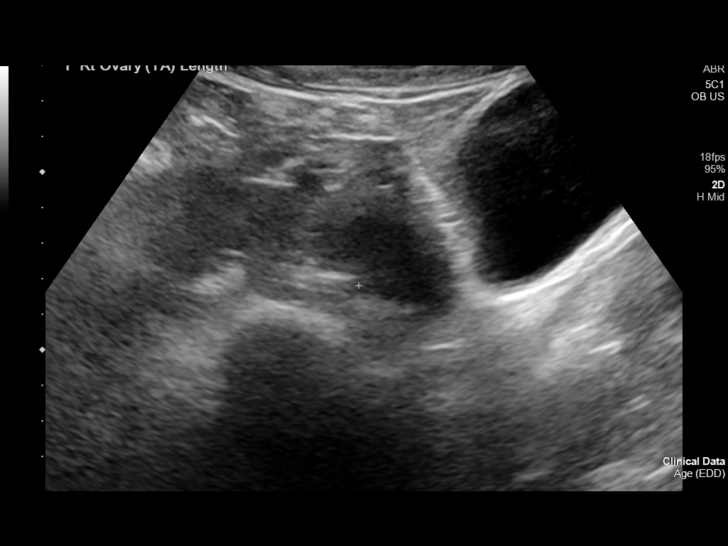
[im 51/55]
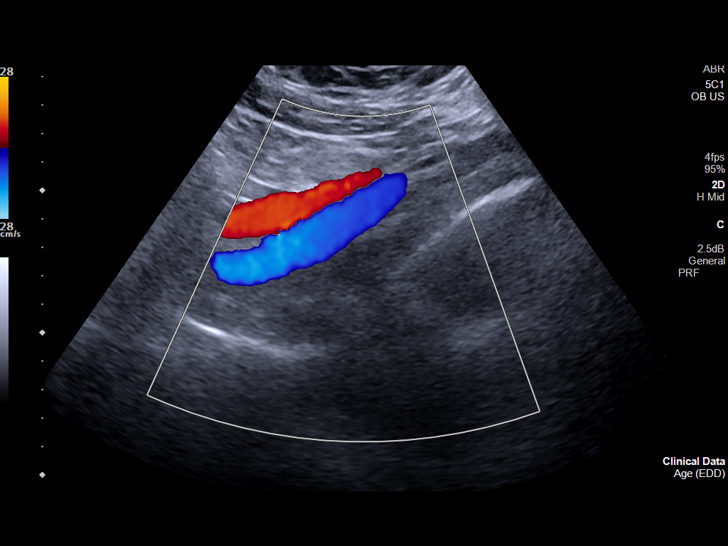
[im 55/55]
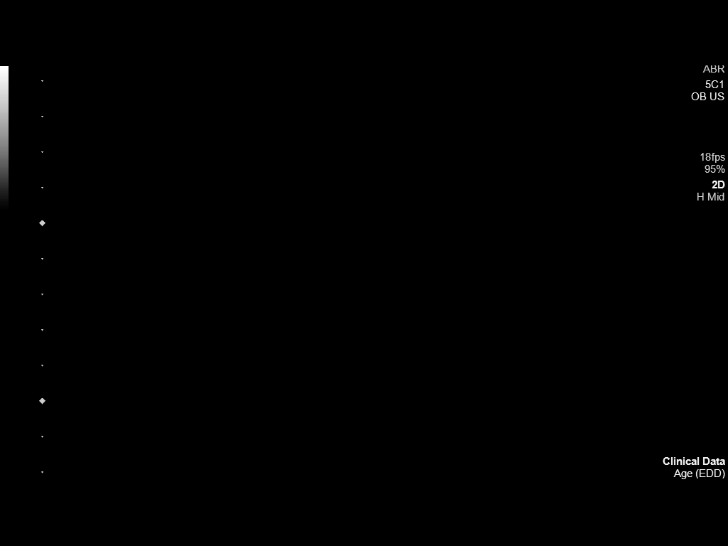

[14 of 28 positions shown; findings below may reference images not displayed]

FINDINGS: Intrauterine gestational sac: Present, single, slightly irregular

Yolk sac:  At present

Embryo:  Present

Cardiac Activity: Present

Heart Rate: 169 bpm

CRL:   33.6 mm   10 w 2 d                  US EDC: 11/29/2019

Subchorionic hemorrhage:  Small subchronic hemorrhage 22 x 9 x 10 mm

Maternal uterus/adnexae:

Maternal uterus otherwise unremarkable.

LEFT ovary not visualized.

RIGHT ovary poorly visualized, grossly unremarkable.

No free pelvic fluid or adnexal masses
IMPRESSION: Single live intrauterine gestation at 10 weeks 2 days EGA by
crown-rump length.

Small subchronic hemorrhage.

## 2020-05-28 IMAGING — US US MFM OB COMPLETE +14 WKS
1 series · 13 of 28 positions shown · non-contrast
Comparison: none

PATIENT INFO:

PERFORMED BY:
 Referred By:      MIGUEL MENDEZ AMBROCIO
SERVICE(S) PROVIDED:
  US MFM OB COMP LESS THAN 14 WEEKS                    76801.4
 ----------------------------------------------------------------------
INDICATIONS:
  12 weeks gestation of pregnancy
FETAL EVALUATION:
 Num Of Fetuses:         1
 Fetal Heart Rate(bpm):  155
 Cardiac Activity:       Present
 Presentation:           Variable
 Placenta:               Anterior
BIOMETRY:
 CRL:      62.1  mm     G. Age:  12w 4d                  EDD:   01/01/20
GESTATIONAL AGE:
 LMP:           12w 6d        Date:  03/25/19                 EDD:   12/30/19
 Best:          12w 6d     Det. By:  LMP  (03/25/19)          EDD:   12/30/19
ANATOMY:
 Choroid Plexus:        Within normal limits   Bladder:                Seen
                        for gestational age
 Stomach:               Seen                   Upper Extremities:      Visualized
 Abdominal Wall:        Within normal limits   Lower Extremities:      Visualized

[Series 1: us mfm ob complete +14 wks · 0.13mm/px · 13 of 29 slices shown]
[im 2/29]
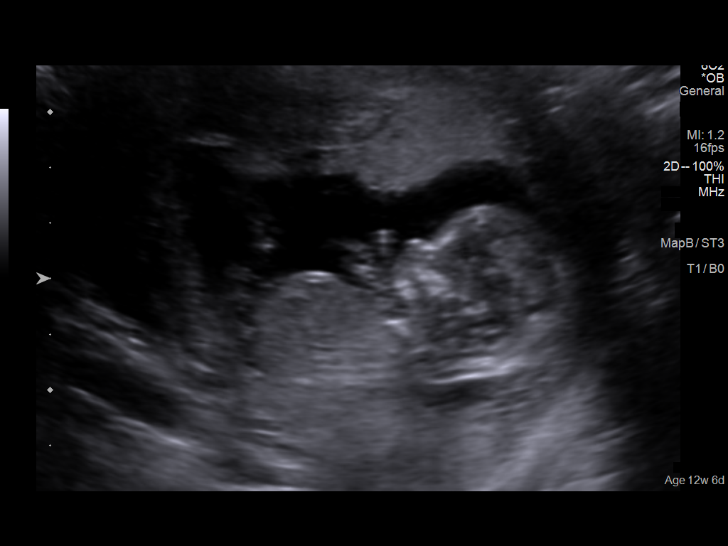
[im 4/29]
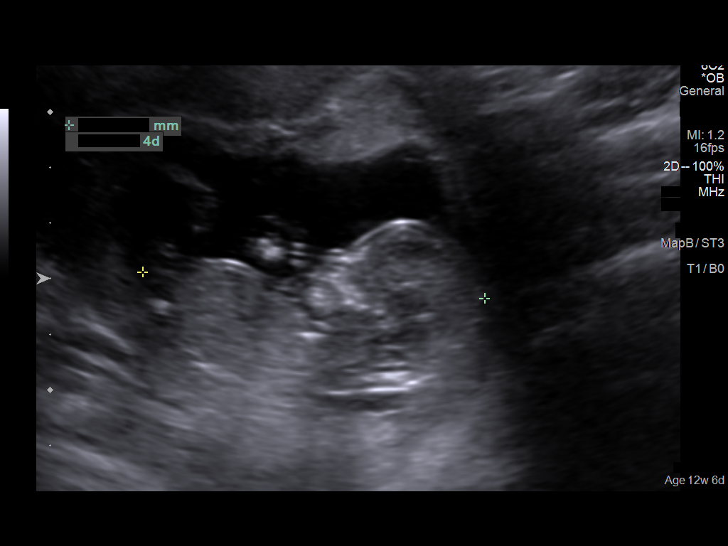
[im 6/29]
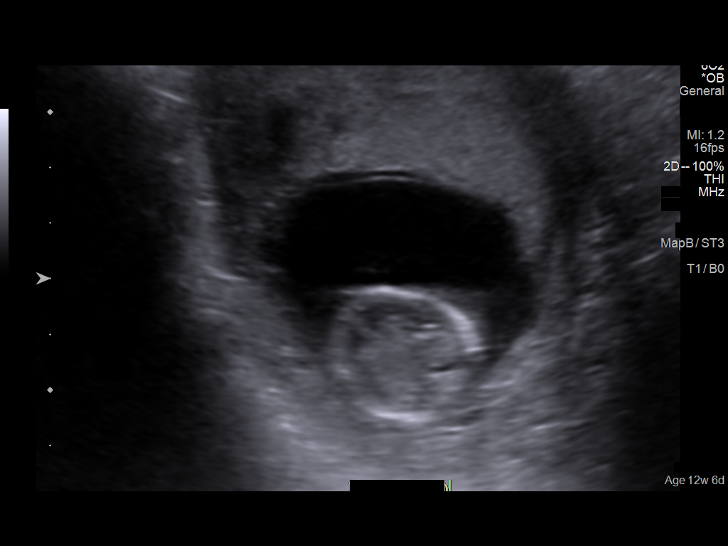
[im 8/29]
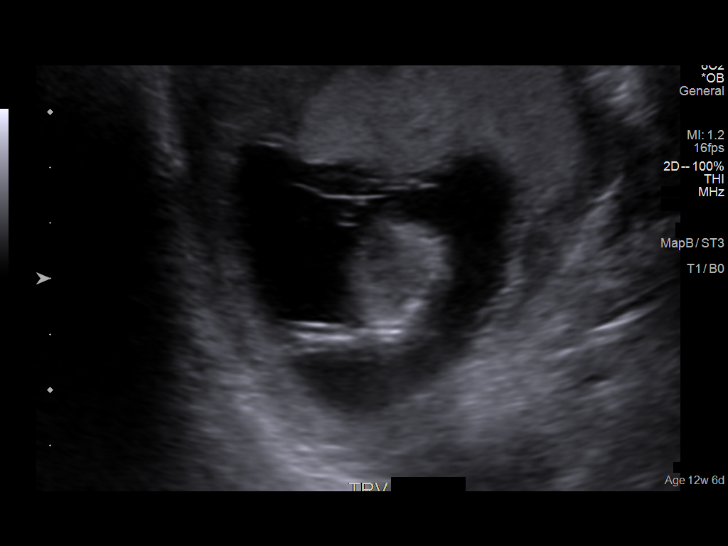
[im 10/29]
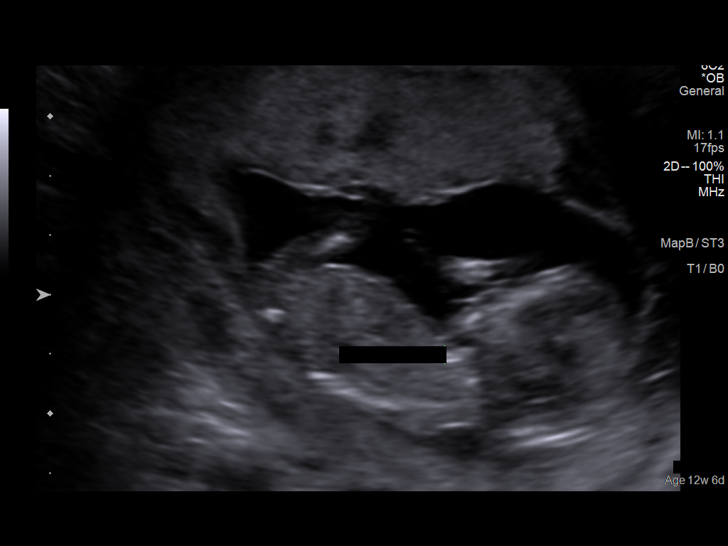
[im 12/29]
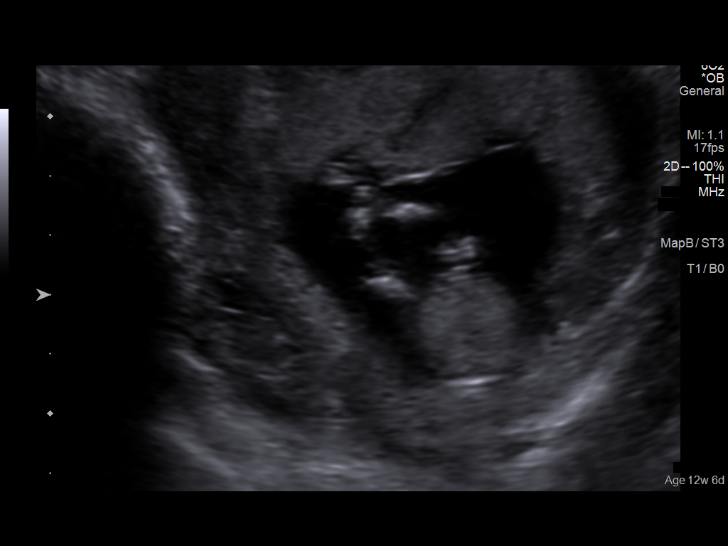
[im 15/29]
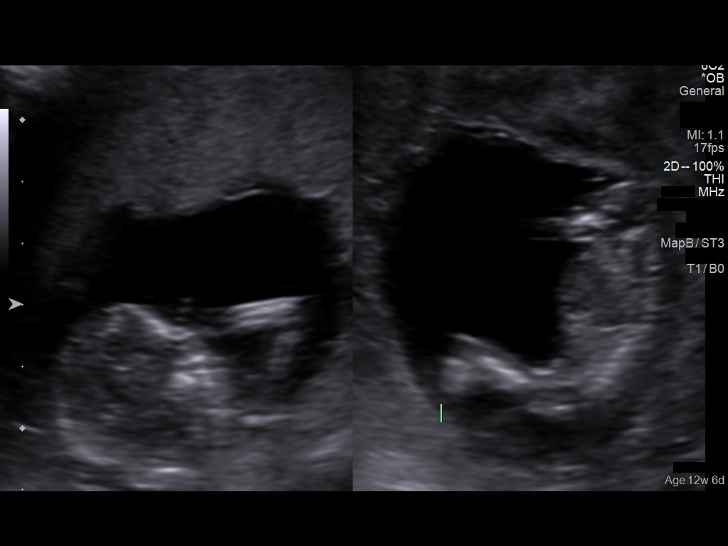
[im 17/29]
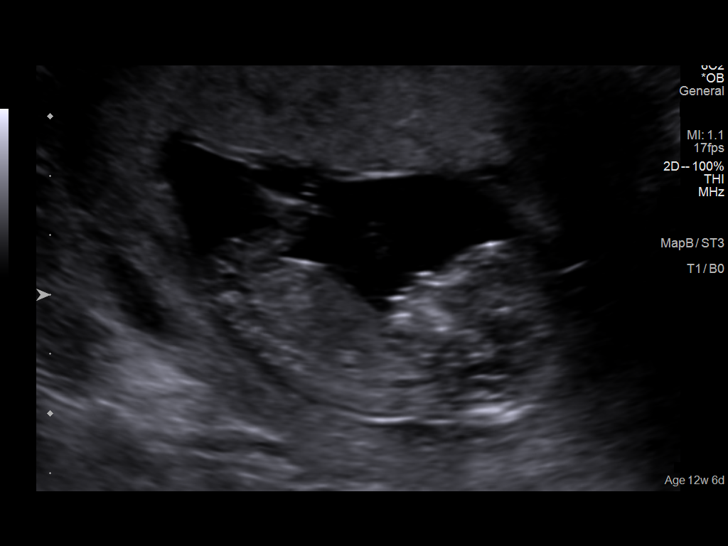
[im 19/29]
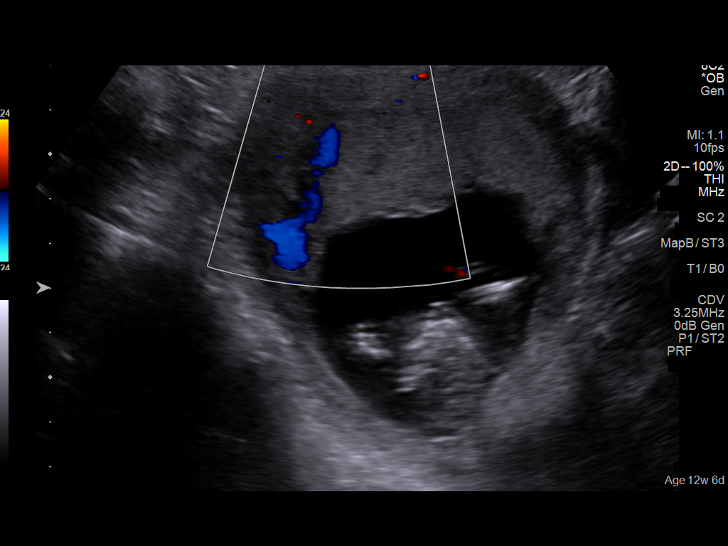
[im 21/29]
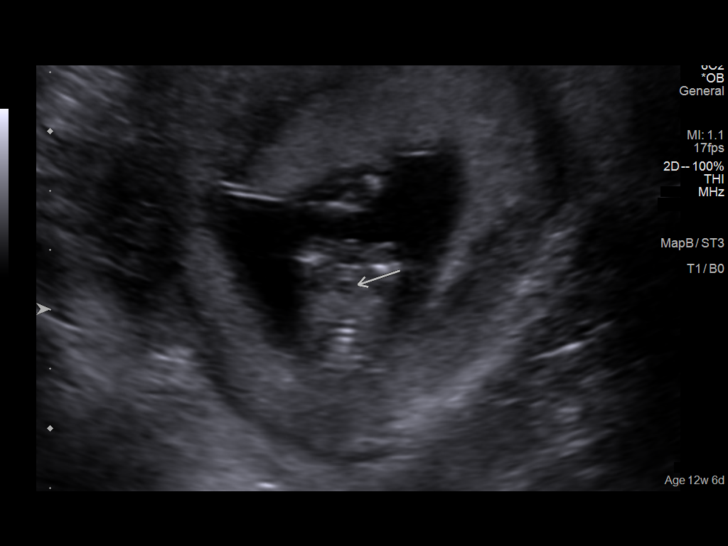
[im 23/29]
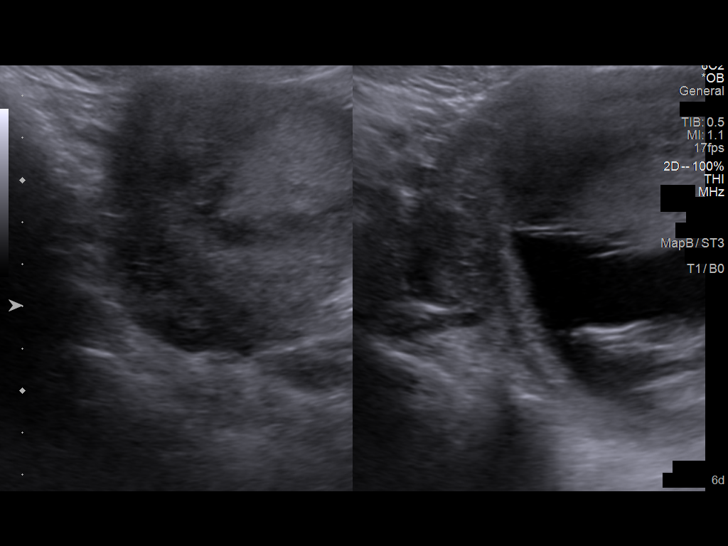
[im 25/29]
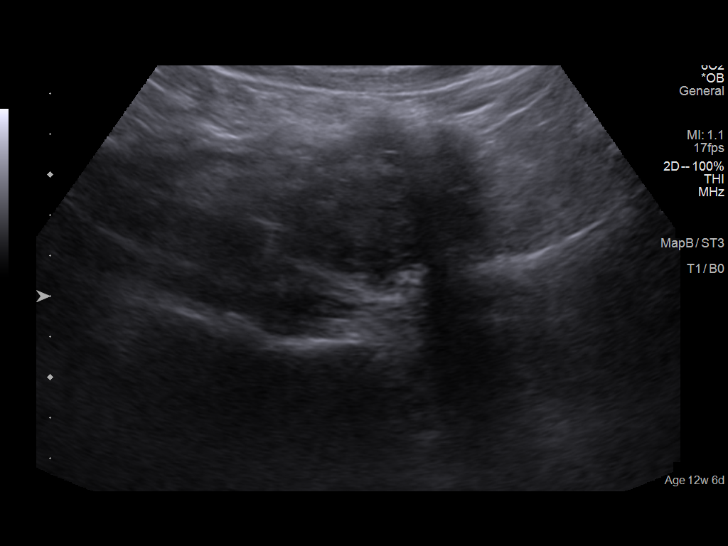
[im 27/29]
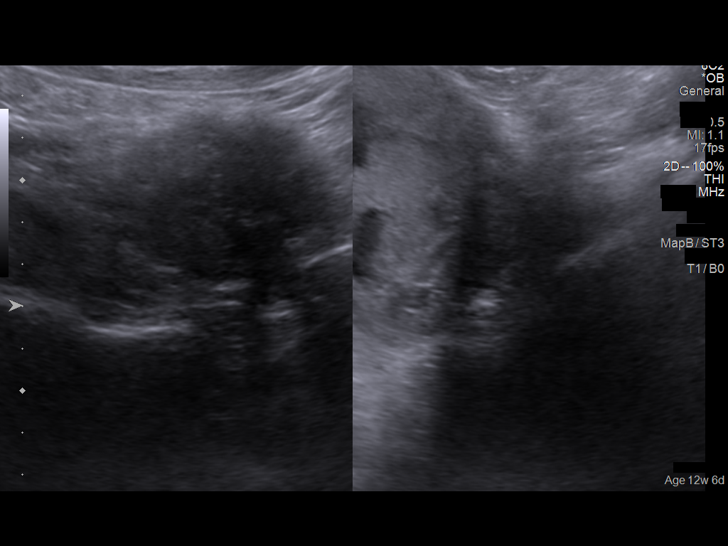

[13 of 28 positions shown; findings below may reference images not displayed]

IMPRESSION: Thank you for referring your patient for ultrasound and
 genetic counseling to discuss aneuploidy screening/testing
 options.  She had the opportunity to meet with our genetic
 counselor today.  Please see that note for details. She
 elected not to have any additional screening/testing beyond
 ultrasound today.
 Ultrasound demonstrates a single, live, fetus at 12 weeks 6
 days.  Dating is by LMP consistent with 10 week ultrasound
 (performed at [HOSPITAL]).
 First trimester anatomic survey demonstrates a symmetric
 choroid plexus, fetal bladder, stomach, and limbs.  The
 gestational age is too early for a complete anatomic survey.
 The NT area is unremarkable.
 Maternal ovaries appear normal bilaterally.
 Thank you for allowing us to participate in your patient's care.
                  Tiger, Dleke

## 2020-06-04 IMAGING — US US OB COMP LESS 14 WK
1 series · 14 of 28 positions shown · non-contrast
Comparison: None.

CLINICAL DATA: VATS

EXAM:
OBSTETRIC <14 WK ULTRASOUND
TECHNIQUE: Transabdominal ultrasound was performed for evaluation of the
gestation as well as the maternal uterus and adnexal regions.

[Series 1: us ob transvaginal · 14 of 47 slices shown]
[im 2/47]
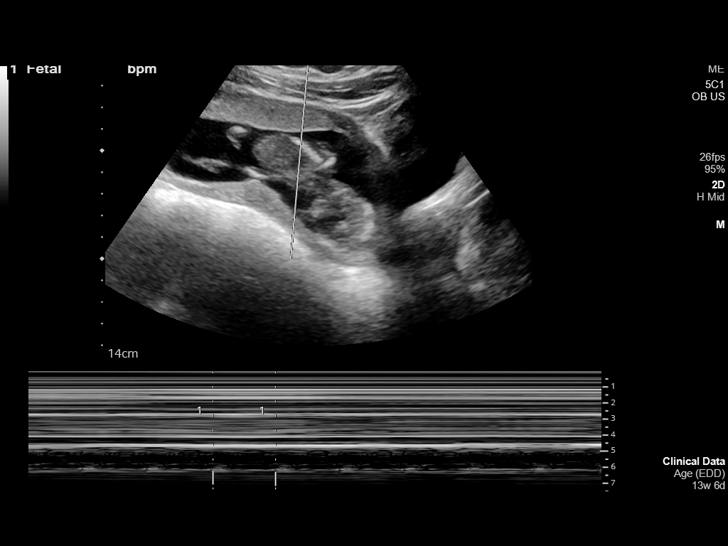
[im 6/47]
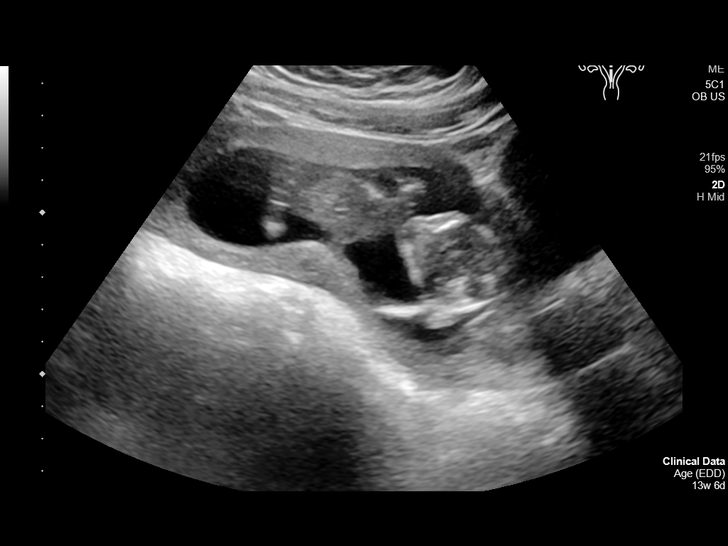
[im 9/47]
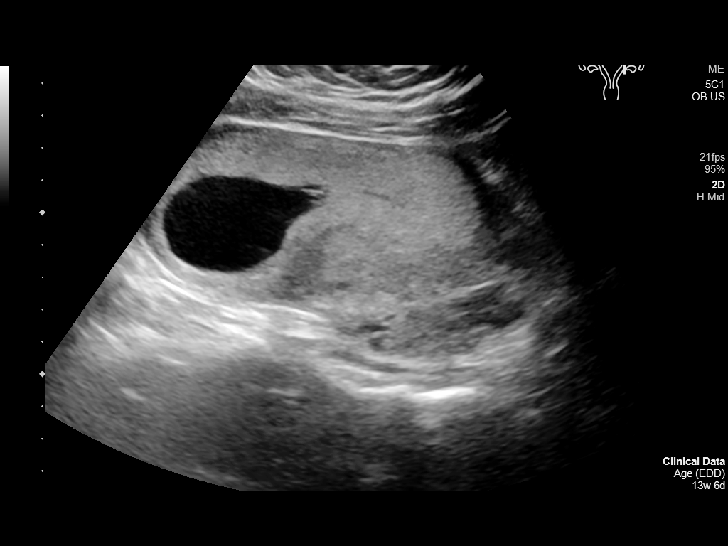
[im 12/47]
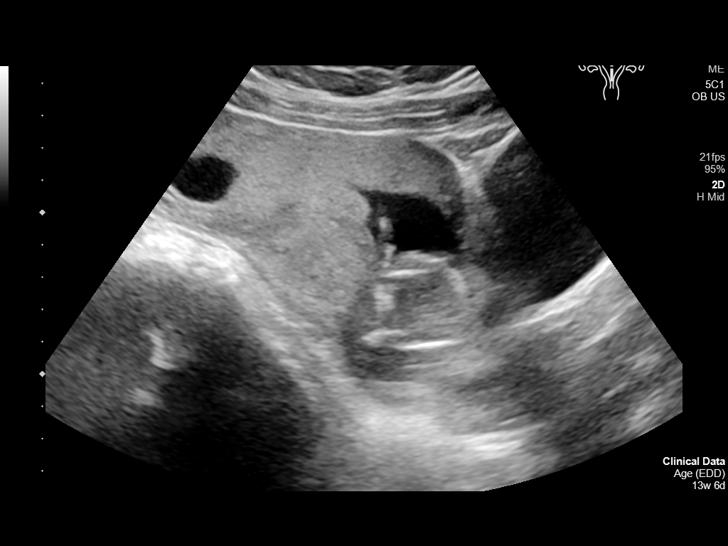
[im 16/47]
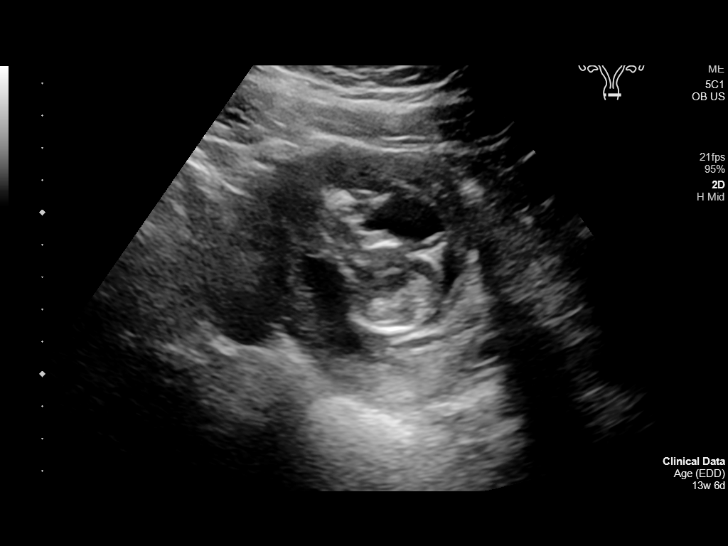
[im 19/47]
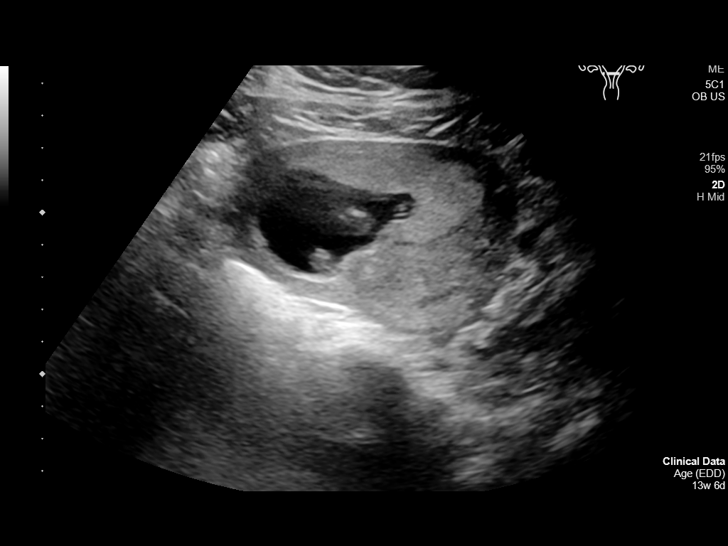
[im 23/47]
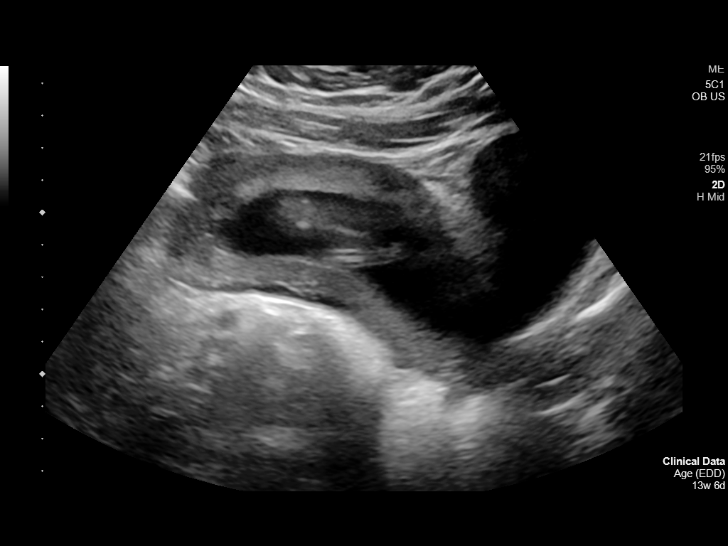
[im 26/47]
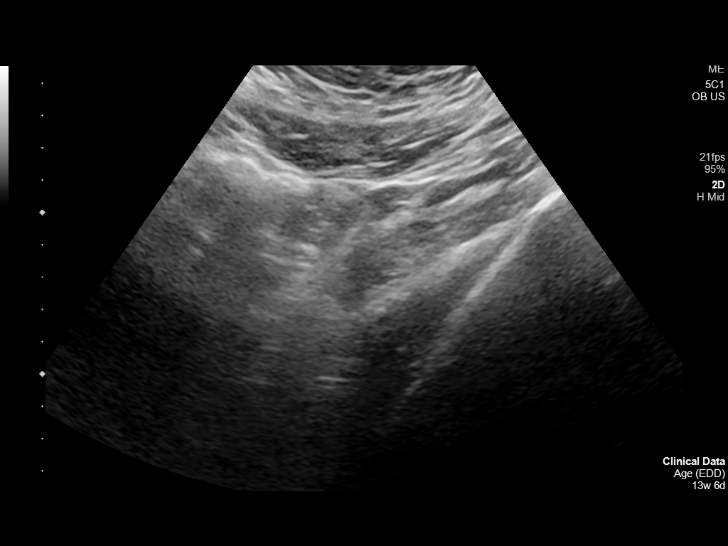
[im 29/47]
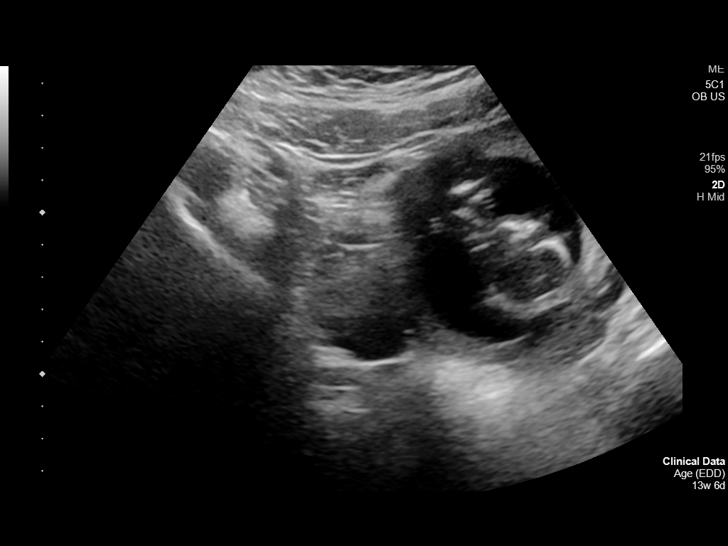
[im 33/47]
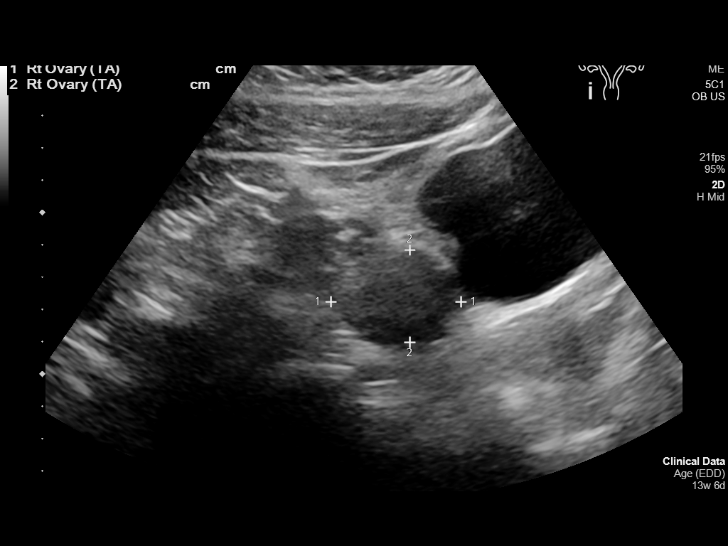
[im 36/47]
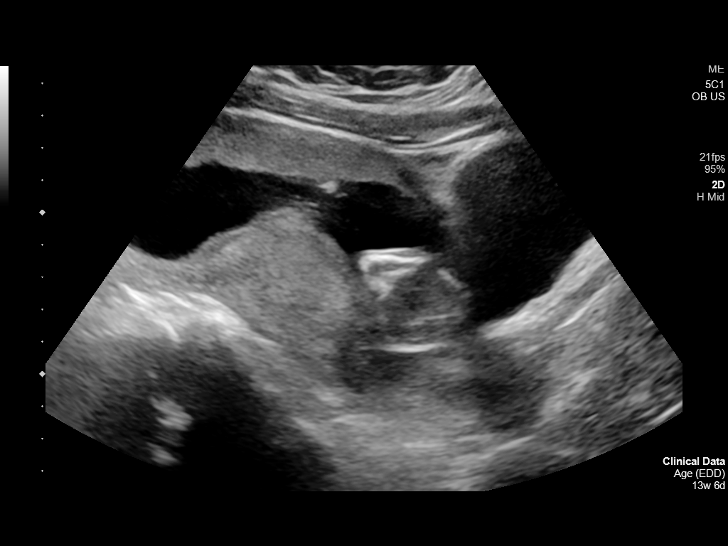
[im 40/47]
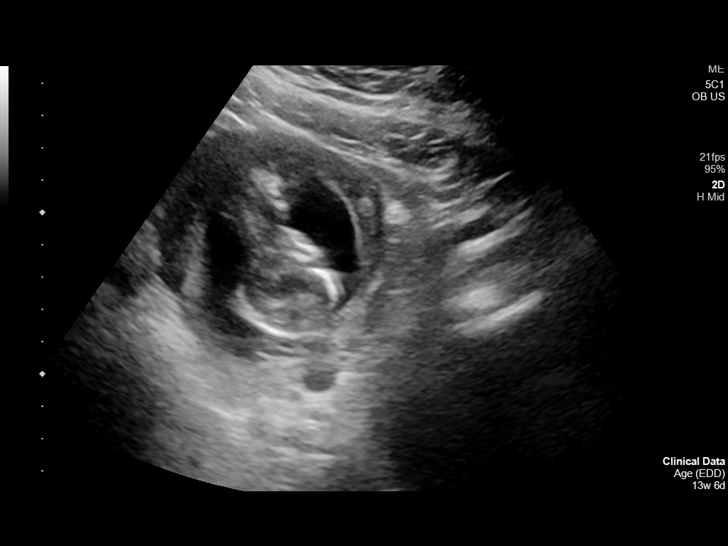
[im 43/47]
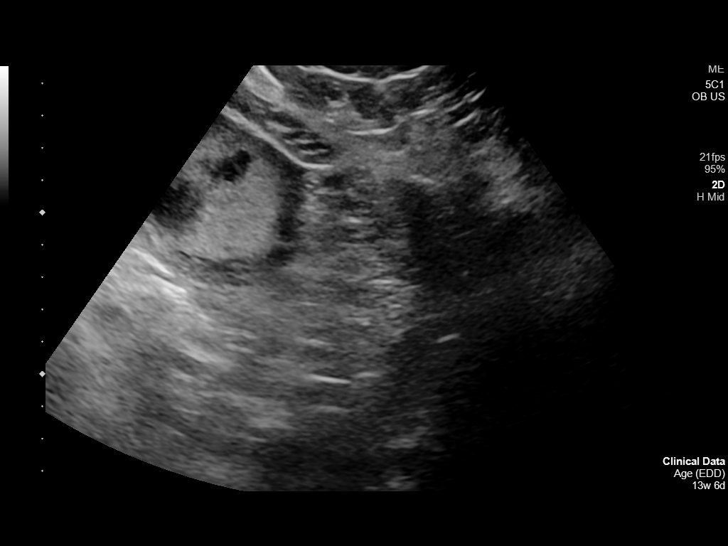
[im 47/47]
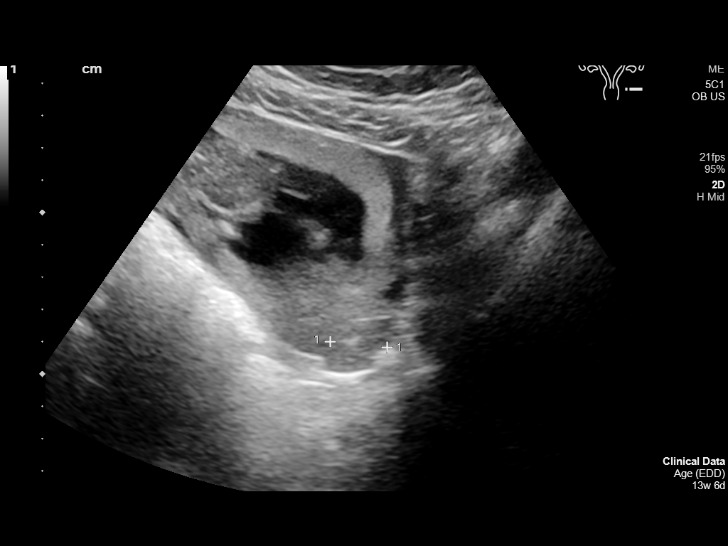

[14 of 28 positions shown; findings below may reference images not displayed]

FINDINGS: Intrauterine gestational sac: Present, single

Yolk sac:  Not visualized

Embryo:  Present

Cardiac Activity: Present

Heart Rate: 160 bpm

CRL:   79.1 mm   13 w 6 d                  US EDC: 12/30/2019

Subchorionic hemorrhage:  None visualized.

Maternal uterus/adnexae:

RIGHT ovary normal size and morphology 4.0 x 2.9 x 2.8 cm.

LEFT ovary normal size and morphology 4.2 x 1.4 x 1.8 cm.

No free pelvic fluid.

No adnexal masses.
IMPRESSION: Single live intrauterine gestation at 13 weeks 6 days EGA.

No acute abnormalities.
